# Patient Record
Sex: Female | Born: 1975 | Race: Black or African American | Hispanic: No | Marital: Single | Smoking: Current every day smoker
Health system: Southern US, Community
[De-identification: ages and names within clinical notes are randomized; demographics above are authoritative.]

## PROBLEM LIST (undated history)

## (undated) DIAGNOSIS — E119 Type 2 diabetes mellitus without complications: Secondary | ICD-10-CM

---

## 2020-09-23 ENCOUNTER — Encounter: Payer: Self-pay | Admitting: *Deleted

## 2020-09-23 ENCOUNTER — Other Ambulatory Visit: Payer: Self-pay

## 2020-09-23 DIAGNOSIS — L03032 Cellulitis of left toe: Secondary | ICD-10-CM | POA: Insufficient documentation

## 2020-09-23 DIAGNOSIS — Z5321 Procedure and treatment not carried out due to patient leaving prior to being seen by health care provider: Secondary | ICD-10-CM | POA: Insufficient documentation

## 2020-09-23 LAB — COMPREHENSIVE METABOLIC PANEL
ALT: 13 U/L (ref 0–44)
AST: 13 U/L — ABNORMAL LOW (ref 15–41)
Albumin: 4.1 g/dL (ref 3.5–5.0)
Alkaline Phosphatase: 81 U/L (ref 38–126)
Anion gap: 9 (ref 5–15)
BUN: 11 mg/dL (ref 6–20)
CO2: 23 mmol/L (ref 22–32)
Calcium: 9.1 mg/dL (ref 8.9–10.3)
Chloride: 101 mmol/L (ref 98–111)
Creatinine, Ser: 0.69 mg/dL (ref 0.44–1.00)
GFR, Estimated: >60 mL/min (ref >60)
Glucose, Bld: 363 mg/dL — ABNORMAL HIGH (ref 70–99)
Potassium: 3.6 mmol/L (ref 3.5–5.1)
Sodium: 133 mmol/L — ABNORMAL LOW (ref 135–145)
Total Bilirubin: 0.6 mg/dL (ref 0.3–1.2)
Total Protein: 7.6 g/dL (ref 6.5–8.1)

## 2020-09-23 LAB — CBC WITH DIFFERENTIAL/PLATELET
Abs Immature Granulocytes: 0.03 10*3/uL (ref 0.00–0.07)
Basophils Absolute: 0 10*3/uL (ref 0.0–0.1)
Basophils Relative: 0 %
Eosinophils Absolute: 0.1 10*3/uL (ref 0.0–0.5)
Eosinophils Relative: 1 %
HCT: 39.8 % (ref 36.0–46.0)
Hemoglobin: 14.2 g/dL (ref 12.0–15.0)
Immature Granulocytes: 0 %
Lymphocytes Relative: 21 %
Lymphs Abs: 2.7 10*3/uL (ref 0.7–4.0)
MCH: 32.2 pg (ref 26.0–34.0)
MCHC: 35.7 g/dL (ref 30.0–36.0)
MCV: 90.2 fL (ref 80.0–100.0)
Monocytes Absolute: 1.1 10*3/uL — ABNORMAL HIGH (ref 0.1–1.0)
Monocytes Relative: 9 %
Neutro Abs: 8.7 10*3/uL — ABNORMAL HIGH (ref 1.7–7.7)
Neutrophils Relative %: 69 %
Platelets: 317 10*3/uL (ref 150–400)
RBC: 4.41 MIL/uL (ref 3.87–5.11)
RDW: 12.5 % (ref 11.5–15.5)
WBC: 12.7 10*3/uL — ABNORMAL HIGH (ref 4.0–10.5)
nRBC: 0 % (ref 0.0–0.2)

## 2020-09-23 LAB — LACTIC ACID, PLASMA: Lactic Acid, Venous: 1.2 mmol/L (ref 0.5–1.9)

## 2020-09-23 NOTE — ED Triage Notes (Addendum)
Pt has acrylic toenails.  Right great toe is red and infected x 2 days.  Pt soaking foot at home without relief.  No known injury to right foot.   hx diabetic.   Pt alert  Speech clear.

## 2020-09-24 ENCOUNTER — Observation Stay
Admission: EM | Admit: 2020-09-24 | Discharge: 2020-09-25 | Disposition: A | Discharge status: Home / Self Care | Payer: Medicaid Other | Attending: Internal Medicine | Admitting: Internal Medicine

## 2020-09-24 ENCOUNTER — Emergency Department: Payer: Medicaid Other

## 2020-09-24 ENCOUNTER — Encounter: Payer: Self-pay | Admitting: Emergency Medicine

## 2020-09-24 ENCOUNTER — Emergency Department
Admission: EM | Admit: 2020-09-24 | Discharge: 2020-09-24 | Disposition: A | Discharge status: Home / Self Care | Payer: Self-pay | Attending: Emergency Medicine | Admitting: Emergency Medicine

## 2020-09-24 DIAGNOSIS — E1165 Type 2 diabetes mellitus with hyperglycemia: Secondary | ICD-10-CM | POA: Diagnosis not present

## 2020-09-24 DIAGNOSIS — F17200 Nicotine dependence, unspecified, uncomplicated: Secondary | ICD-10-CM | POA: Diagnosis present

## 2020-09-24 DIAGNOSIS — L089 Local infection of the skin and subcutaneous tissue, unspecified: Secondary | ICD-10-CM | POA: Diagnosis present

## 2020-09-24 DIAGNOSIS — E11628 Type 2 diabetes mellitus with other skin complications: Secondary | ICD-10-CM

## 2020-09-24 DIAGNOSIS — Z20822 Contact with and (suspected) exposure to covid-19: Secondary | ICD-10-CM | POA: Insufficient documentation

## 2020-09-24 DIAGNOSIS — M79671 Pain in right foot: Secondary | ICD-10-CM | POA: Diagnosis present

## 2020-09-24 DIAGNOSIS — E119 Type 2 diabetes mellitus without complications: Secondary | ICD-10-CM | POA: Insufficient documentation

## 2020-09-24 HISTORY — DX: Type 2 diabetes mellitus without complications: E11.9

## 2020-09-24 LAB — URINALYSIS, COMPLETE (UACMP) WITH MICROSCOPIC
Bacteria, UA: NONE SEEN
Bilirubin Urine: NEGATIVE
Glucose, UA: >=500 mg/dL — AB
Ketones, ur: 5 mg/dL — AB
Leukocytes,Ua: NEGATIVE
Nitrite: NEGATIVE
Protein, ur: NEGATIVE mg/dL
Specific Gravity, Urine: 1.039 — ABNORMAL HIGH (ref 1.005–1.030)
pH: 5 (ref 5.0–8.0)

## 2020-09-24 LAB — CBC WITH DIFFERENTIAL/PLATELET
Abs Immature Granulocytes: 0.05 10*3/uL (ref 0.00–0.07)
Basophils Absolute: 0 10*3/uL (ref 0.0–0.1)
Basophils Relative: 0 %
Eosinophils Absolute: 0.1 10*3/uL (ref 0.0–0.5)
Eosinophils Relative: 1 %
HCT: 41.9 % (ref 36.0–46.0)
Hemoglobin: 14.5 g/dL (ref 12.0–15.0)
Immature Granulocytes: 0 %
Lymphocytes Relative: 18 %
Lymphs Abs: 2.2 10*3/uL (ref 0.7–4.0)
MCH: 32.3 pg (ref 26.0–34.0)
MCHC: 34.6 g/dL (ref 30.0–36.0)
MCV: 93.3 fL (ref 80.0–100.0)
Monocytes Absolute: 1.3 10*3/uL — ABNORMAL HIGH (ref 0.1–1.0)
Monocytes Relative: 10 %
Neutro Abs: 8.9 10*3/uL — ABNORMAL HIGH (ref 1.7–7.7)
Neutrophils Relative %: 71 %
Platelets: 283 10*3/uL (ref 150–400)
RBC: 4.49 MIL/uL (ref 3.87–5.11)
RDW: 12.5 % (ref 11.5–15.5)
WBC: 12.7 10*3/uL — ABNORMAL HIGH (ref 4.0–10.5)
nRBC: 0 % (ref 0.0–0.2)

## 2020-09-24 LAB — COMPREHENSIVE METABOLIC PANEL
ALT: 11 U/L (ref 0–44)
AST: 12 U/L — ABNORMAL LOW (ref 15–41)
Albumin: 4 g/dL (ref 3.5–5.0)
Alkaline Phosphatase: 75 U/L (ref 38–126)
Anion gap: 11 (ref 5–15)
BUN: 11 mg/dL (ref 6–20)
CO2: 24 mmol/L (ref 22–32)
Calcium: 8.9 mg/dL (ref 8.9–10.3)
Chloride: 101 mmol/L (ref 98–111)
Creatinine, Ser: 0.53 mg/dL (ref 0.44–1.00)
GFR, Estimated: >60 mL/min (ref >60)
Glucose, Bld: 291 mg/dL — ABNORMAL HIGH (ref 70–99)
Potassium: 3.4 mmol/L — ABNORMAL LOW (ref 3.5–5.1)
Sodium: 136 mmol/L (ref 135–145)
Total Bilirubin: 0.9 mg/dL (ref 0.3–1.2)
Total Protein: 8 g/dL (ref 6.5–8.1)

## 2020-09-24 LAB — GLUCOSE, CAPILLARY
Glucose-Capillary: 249 mg/dL — ABNORMAL HIGH (ref 70–99)
Glucose-Capillary: 262 mg/dL — ABNORMAL HIGH (ref 70–99)

## 2020-09-24 LAB — HIV ANTIBODY (ROUTINE TESTING W REFLEX): HIV Screen 4th Generation wRfx: NONREACTIVE

## 2020-09-24 LAB — LACTIC ACID, PLASMA
Lactic Acid, Venous: 1.9 mmol/L (ref 0.5–1.9)
Lactic Acid, Venous: 1.5 mmol/L (ref 0.5–1.9)

## 2020-09-24 LAB — CBG MONITORING, ED: Glucose-Capillary: 279 mg/dL — ABNORMAL HIGH (ref 70–99)

## 2020-09-24 LAB — SARS CORONAVIRUS 2 (TAT 6-24 HRS): SARS Coronavirus 2: NEGATIVE

## 2020-09-24 MED ORDER — INSULIN ASPART 100 UNIT/ML IJ SOLN
0.0000 [IU] | Freq: Three times a day (TID) | INTRAMUSCULAR | Status: DC
  Administered 2020-09-24 – 2020-09-25 (×2): 5 [IU] via SUBCUTANEOUS
  Administered 2020-09-25: 3 [IU] via SUBCUTANEOUS
  Filled 2020-09-24 (×3): qty 1

## 2020-09-24 MED ORDER — VANCOMYCIN HCL IN DEXTROSE 1-5 GM/200ML-% IV SOLN
1000.0000 mg | Freq: Once | INTRAVENOUS | Status: AC
  Administered 2020-09-24: 1000 mg via INTRAVENOUS
  Filled 2020-09-24: qty 200

## 2020-09-24 MED ORDER — NICOTINE 21 MG/24HR TD PT24
21.0000 mg | MEDICATED_PATCH | Freq: Every day | TRANSDERMAL | Status: DC
  Administered 2020-09-24 – 2020-09-25 (×2): 21 mg via TRANSDERMAL
  Filled 2020-09-24 (×2): qty 1

## 2020-09-24 MED ORDER — ONDANSETRON HCL 4 MG PO TABS: 4.0000 mg | ORAL_TABLET | Freq: Four times a day (QID) | ORAL | Status: DC | PRN

## 2020-09-24 MED ORDER — SODIUM CHLORIDE 0.9 % IV SOLN: INTRAVENOUS | Status: DC

## 2020-09-24 MED ORDER — TRAMADOL HCL 50 MG PO TABS
50.0000 mg | ORAL_TABLET | Freq: Three times a day (TID) | ORAL | Status: DC | PRN
Start: 2020-09-24 — End: 2020-09-25
  Administered 2020-09-24 – 2020-09-25 (×3): 50 mg via ORAL
  Filled 2020-09-24 (×3): qty 1

## 2020-09-24 MED ORDER — ENOXAPARIN SODIUM 40 MG/0.4ML IJ SOSY
40.0000 mg | PREFILLED_SYRINGE | INTRAMUSCULAR | Status: DC
  Administered 2020-09-24: 40 mg via SUBCUTANEOUS
  Filled 2020-09-24: qty 0.4

## 2020-09-24 MED ORDER — CEFAZOLIN SODIUM-DEXTROSE 1-4 GM/50ML-% IV SOLN
1.0000 g | Freq: Three times a day (TID) | INTRAVENOUS | Status: DC
  Administered 2020-09-24 – 2020-09-25 (×3): 1 g via INTRAVENOUS
  Filled 2020-09-24 (×7): qty 50

## 2020-09-24 MED ORDER — ONDANSETRON HCL 4 MG/2ML IJ SOLN: 4.0000 mg | Freq: Four times a day (QID) | INTRAMUSCULAR | Status: DC | PRN

## 2020-09-24 MED ORDER — SODIUM CHLORIDE 0.9 % IV SOLN
1.0000 g | Freq: Once | INTRAVENOUS | Status: AC
  Administered 2020-09-24: 1 g via INTRAVENOUS
  Filled 2020-09-24: qty 1

## 2020-09-24 NOTE — ED Notes (Signed)
Lab called for blood culture draw only need one

## 2020-09-24 NOTE — ED Notes (Signed)
Visitor remains at bedside. Pt watching tv and eating.

## 2020-09-24 NOTE — ED Notes (Signed)
Called Camera operator to find out which RN assigned to pt as not currently listed in chart. Will report once floor RN info given to this RN.

## 2020-09-24 NOTE — ED Provider Notes (Signed)
[facility] Emergency Department Provider Note   ____________________________________________   Event Date/Time   First MD Initiated Contact with Patient 09/24/20 1007     (approximate)  I have reviewed the triage vital signs and the nursing notes.   HISTORY  Chief Complaint Foot Pain   HPI Anne Hanson is a 45 y.o. female with past medical history of diabetes who presents to the ED complaining of foot pain.  Patient states that she had her nails done about 1 week ago, since then has noticed increasing pain, redness, and swelling to her right great toe.  Redness and pain has started to track up the dorsum of her right foot and into her leg.  She denies any associated fevers, nausea, vomiting, cough, chest pain, or shortness of breath.  She has not noticed any ulcers or open wounds to her foot.  She has never had similar issues with her feet before.        History reviewed. No pertinent past medical history.  There are no problems to display for this patient.   History reviewed. No pertinent surgical history.  Prior to Admission medications   Not on File    Allergies Penicillins  No family history on file.  Social History Social History   Tobacco Use  . Smoking status: Never Smoker  . Smokeless tobacco: Never Used    Review of Systems  Constitutional: No fever/chills Eyes: No visual changes. ENT: No sore throat. Cardiovascular: Denies chest pain. Respiratory: Denies shortness of breath. Gastrointestinal: No abdominal pain.  No nausea, no vomiting.  No diarrhea.  No constipation. Genitourinary: Negative for dysuria. Musculoskeletal: Negative for back pain.  Positive for foot pain and swelling. Skin: Negative for rash. Neurological: Negative for headaches, focal weakness or numbness.  ____________________________________________   PHYSICAL EXAM:  VITAL SIGNS: ED Triage Vitals  Enc Vitals Group     BP 09/24/20 1000 (!)  135/91     Pulse Rate 09/24/20 1000 99     Resp 09/24/20 1000 20     Temp 09/24/20 1000 98.3 F (36.8 C)     Temp Source 09/24/20 1000 Oral     SpO2 09/24/20 1000 100 %     Weight 09/24/20 0957 175 lb 14.8 oz (79.8 kg)     Height 09/24/20 0957 5\' 6"  (1.676 m)     Head Circumference --      Peak Flow --      Pain Score 09/24/20 0957 5     Pain Loc --      Pain Edu? --      Excl. in GC? --     Constitutional: Alert and oriented. Eyes: Conjunctivae are normal. Head: Atraumatic. Nose: No congestion/rhinnorhea. Mouth/Throat: Mucous membranes are moist. Neck: Normal ROM Cardiovascular: Normal rate, regular rhythm. Grossly normal heart sounds.  2+ DP pulses bilaterally. Respiratory: Normal respiratory effort.  No retractions. Lungs CTAB. Gastrointestinal: Soft and nontender. No distention. Genitourinary: deferred Musculoskeletal: Circumferential edema, erythema, and warmth to right great toe with erythema tracking along dorsum of right foot and into medial portion of her right lower leg.  Great toe and dorsum of right foot are tender to palpation. Neurologic:  Normal speech and language. No gross focal neurologic deficits are appreciated. Skin:  Skin is warm, dry and intact. No rash noted. Psychiatric: Mood and affect are normal. Speech and behavior are normal.  ____________________________________________   LABS (all labs ordered are listed, but only abnormal results are displayed)  Labs Reviewed  CBC WITH DIFFERENTIAL/PLATELET - Abnormal; Notable for the following components:      Result Value   WBC 12.7 (*)    Neutro Abs 8.9 (*)    Monocytes Absolute 1.3 (*)    All other components within normal limits  CBG MONITORING, ED - Abnormal; Notable for the following components:   Glucose-Capillary 279 (*)    All other components within normal limits  CULTURE, BLOOD (ROUTINE X 2)  CULTURE, BLOOD (ROUTINE X 2)  SARS CORONAVIRUS 2 (TAT 6-24 HRS)  LACTIC ACID, PLASMA  LACTIC ACID,  PLASMA  URINALYSIS, COMPLETE (UACMP) WITH MICROSCOPIC  COMPREHENSIVE METABOLIC PANEL    PROCEDURES  Procedure(s) performed (including Critical Care):  Procedures   ____________________________________________   INITIAL IMPRESSION / ASSESSMENT AND PLAN / ED COURSE       45 year old female with past medical history of diabetes presents to the ED with 1 week of increasing pain and swelling to her right great toe now with redness tracking up her right leg.  She denies any systemic symptoms and vital signs are reassuring, no evidence of sepsis at this time.  Appearance of toe is concerning for osteomyelitis and we will further assess with x-ray, also check labs including blood cultures.  We will start on broad-spectrum antibiotics and anticipate admission for further management.  Foot x-ray reviewed by me with no obvious signs of osteomyelitis.  Patient tolerated cefepime with no signs of allergic reaction at this time.  Case discussed with hospitalist for admission.      ____________________________________________   FINAL CLINICAL IMPRESSION(S) / ED DIAGNOSES  Final diagnoses:  Diabetic foot infection [facility])     ED Discharge Orders    None       Note:  This document was prepared using Dragon voice recognition software and may include unintentional dictation errors.   Chesley Noon, MD 09/24/20 [phone]

## 2020-09-24 NOTE — ED Notes (Signed)
Advised Lab that a tech still hasn't drawn blood on the pt

## 2020-09-24 NOTE — ED Notes (Signed)
Called dietary and ordered lunch tray for pt.

## 2020-09-24 NOTE — ED Notes (Signed)
Dietary staff dropped off food tray to bedside.

## 2020-09-24 NOTE — H&P (Signed)
History and Physical    Jaylina Ramdass EPP:[id number] DOB: 08/28/1975 DOA: 09/24/2020  PCP: System, Provider Not In   Patient coming from: Home  I have personally briefly reviewed patient's old medical records in [facility]  Chief Complaint: Right great toe redness and swelling  HPI: Atalia Litzinger is a 45 y.o. female with medical history significant for nicotine dependence and insulin-dependent diabetes mellitus who presents to the emergency room for evaluation of pain, redness and swelling involving her right great toe with streaking involving the dorsum of her right foot. Symptoms have been ongoing for 3 days and has progressively worsened.  Patient noticed her symptoms a couple of days after having a pedicure. She denies having any fever, no chills, no nausea, no vomiting, no chest pain, no cough, no shortness of breath, no headache, no dizziness, no lightheadedness, no changes in her bowel habits, no abdominal pain. Labs show sodium 136, potassium 3.4, chloride 101, bicarb 24, glucose 291, BUN 11, creatinine 0.53, calcium 8.9, alkaline phosphatase 75, albumin 4.0, AST 12, ALT 11, total protein 8.0, lactic acid 1.9, white count 12.7, hemoglobin 14.5, hematocrit 41.9, MCV 93.3, RDW 12.5, platelet count 283 Right foot x-ray shows no acute abnormality of the right foot. If there is continued concern for great toe great toe osteomyelitis, further evaluation with MRI should be performed.   ED Course: Patient is a 45 year old female with a history of nicotine dependence as well as insulin dependent diabetes mellitus who presents to the emergency room for evaluation of redness, pain and swelling involving her right great toe and streaking involving the dorsum of the right foot. She received a dose of IV vancomycin and cefepime in the ER and will be admitted to the hospital for further evaluation.    Review of Systems: As per HPI otherwise all other systems reviewed and negative.     Past Medical History:  Diagnosis Date  . Diabetes mellitus without complication (HCC)     History reviewed. No pertinent surgical history.   reports that she has been smoking cigarettes. She has been smoking about 1.00 pack per day. She has never used smokeless tobacco. She reports that she does not drink alcohol and does not use drugs.  Allergies  Allergen Reactions  . Penicillins Nausea Only    Family History  Problem Relation Age of Onset  . Diabetes Mother       Prior to Admission medications   Not on File    Physical Exam: Vitals:   09/24/20 0957 09/24/20 1000 09/24/20 1230  BP:  (!) 135/91 115/70  Pulse:  99 78  Resp:  20   Temp:  98.3 F (36.8 C)   TempSrc:  Oral   SpO2:  100% 98%  Weight: 79.8 kg 79.8 kg   Height: 5\' 6"  (1.676 m) 5\' 6"  (1.676 m)      Vitals:   09/24/20 0957 09/24/20 1000 09/24/20 1230  BP:  (!) 135/91 115/70  Pulse:  99 78  Resp:  20   Temp:  98.3 F (36.8 C)   TempSrc:  Oral   SpO2:  100% 98%  Weight: 79.8 kg 79.8 kg   Height: 5\' 6"  (1.676 m) 5\' 6"  (1.676 m)       Constitutional: Alert and oriented x 3 . Not in any apparent distress HEENT:      Head: Normocephalic and atraumatic.         Eyes: PERLA, EOMI, Conjunctivae are normal. Sclera is non-icteric.  Mouth/Throat: Mucous membranes are moist.       Neck: Supple with no signs of meningismus. Cardiovascular: Regular rate and rhythm. No murmurs, gallops, or rubs. 2+ symmetrical distal pulses are present . No JVD. No LE edema Respiratory: Respiratory effort normal .Lungs sounds clear bilaterally. No wheezes, crackles, or rhonchi.  Gastrointestinal: Soft, non tender, and non distended with positive bowel sounds.  Genitourinary: No CVA tenderness. Musculoskeletal: Nontender with normal range of motion in all extremities. No cyanosis, or erythema of extremities. Neurologic:  Face is symmetric. Moving all extremities. No gross focal neurologic deficits  Skin: Skin is  warm, dry.  Redness and swelling of the right great toe with streaking to involve the dorsum of the right foot. Psychiatric: Mood and affect are normal   Labs on Admission: I have personally reviewed following labs and imaging studies  CBC: Recent Labs  Lab 09/23/20 2257 09/24/20 1000  WBC 12.7* 12.7*  NEUTROABS 8.7* 8.9*  HGB 14.2 14.5  HCT 39.8 41.9  MCV 90.2 93.3  PLT 317 283   Basic Metabolic Panel: Recent Labs  Lab 09/23/20 2257 09/24/20 1236  NA 133* 136  K 3.6 3.4*  CL 101 101  CO2 23 24  GLUCOSE 363* 291*  BUN 11 11  CREATININE 0.69 0.53  CALCIUM 9.1 8.9   GFR: Estimated Creatinine Clearance: 94.6 mL/min (by C-G formula based on SCr of 0.53 mg/dL). Liver Function Tests: Recent Labs  Lab 09/23/20 2257 09/24/20 1236  AST 13* 12*  ALT 13 11  ALKPHOS 81 75  BILITOT 0.6 0.9  PROT 7.6 8.0  ALBUMIN 4.1 4.0   No results for input(s): LIPASE, AMYLASE in the last 168 hours. No results for input(s): AMMONIA in the last 168 hours. Coagulation Profile: No results for input(s): INR, PROTIME in the last 168 hours. Cardiac Enzymes: No results for input(s): CKTOTAL, CKMB, CKMBINDEX, TROPONINI in the last 168 hours. BNP (last 3 results) No results for input(s): PROBNP in the last 8760 hours. HbA1C: No results for input(s): HGBA1C in the last 72 hours. CBG: Recent Labs  Lab 09/24/20 1138  GLUCAP 279*   Lipid Profile: No results for input(s): CHOL, HDL, LDLCALC, TRIG, CHOLHDL, LDLDIRECT in the last 72 hours. Thyroid Function Tests: No results for input(s): TSH, T4TOTAL, FREET4, T3FREE, THYROIDAB in the last 72 hours. Anemia Panel: No results for input(s): VITAMINB12, FOLATE, FERRITIN, TIBC, IRON, RETICCTPCT in the last 72 hours. Urine analysis: No results found for: COLORURINE, APPEARANCEUR, LABSPEC, PHURINE, GLUCOSEU, HGBUR, BILIRUBINUR, KETONESUR, PROTEINUR, UROBILINOGEN, NITRITE, LEUKOCYTESUR  Radiological Exams on Admission: DG Foot 2 Views  Right  Result Date: 09/24/2020 CLINICAL DATA:  Infection Right great toe is red and infected for 3 days EXAM: RIGHT FOOT - 2 VIEW COMPARISON:  None. FINDINGS: There is no evidence of fracture or dislocation. There is no evidence of arthropathy or other focal bone abnormality. Soft tissues are unremarkable. IMPRESSION: No acute abnormality of the right foot. If there is continued concern for right great toe osteomyelitis, further evaluation with MRI should be performed. Electronically Signed   By: Acquanetta Belling M.D.   On: 09/24/2020 11:18     Assessment/Plan Principal Problem:   Diabetic foot infection (HCC) Active Problems:   Nicotine dependence   Diabetes mellitus with hyperglycemia (HCC)     Diabetic foot infection/cellulitis of right great toe Patient presents for evaluation of redness, swelling and differential warmth involving the right great toe with streaking involving the dorsum of the right foot. She has a white count  of 12,000 but is afebrile We will start patient empirically on IV cefazolin Keep right foot elevated Consult podiatry    Diabetes mellitus with hyperglycemia Patient has a history of insulin-dependent diabetes mellitus She has hyperglycemia most likely related to her infectious process We will place patient on sliding scale insulin Accu-Cheks before meals and at bedtime      Nicotine dependence Smoking cessation has been discussed with patient in detail We will place patient on a nicotine transdermal patch 21 mg daily      DVT prophylaxis: Lovenox Code Status: full code Family Communication: Greater than 50% of time was spent discussing plan of care with patient at the bedside.  All questions and concerns have been addressed.  She verbalizes understanding and agrees to the plan. Disposition Plan: Back to previous home environment Consults called: Podiatry Status: At the time of admission, it appears that the appropriate admission status for this  patient is inpatient. This is judged to be reasonable and necessary in order to provide the required intensity of service to ensure the patient's safety given the presenting symptoms, physical exam findings and initial radiographic and laboratory data in the context of their comorbid conditions. Patient requires inpatient status due to high intensity of service, high risk for further deterioration and high frequency of surveillance required.    Lucile Shutters MD [facility]     09/24/2020, 1:17 PM

## 2020-09-24 NOTE — ED Notes (Signed)
Informed RN bed assigned 

## 2020-09-24 NOTE — ED Triage Notes (Signed)
C/O swelling to right great toe 3 days ago and redness to great toe and redness streaking up right leg.

## 2020-09-24 NOTE — ED Notes (Signed)
Lab advised green top hemolyzed , advised that we still need blood culture #2 to be drawn, and to have lab tech draw green tube with blood culture.

## 2020-09-25 DIAGNOSIS — L089 Local infection of the skin and subcutaneous tissue, unspecified: Secondary | ICD-10-CM | POA: Diagnosis not present

## 2020-09-25 DIAGNOSIS — E11628 Type 2 diabetes mellitus with other skin complications: Secondary | ICD-10-CM

## 2020-09-25 LAB — CBC
HCT: 37.8 % (ref 36.0–46.0)
Hemoglobin: 13.1 g/dL (ref 12.0–15.0)
MCH: 31.6 pg (ref 26.0–34.0)
MCHC: 34.7 g/dL (ref 30.0–36.0)
MCV: 91.3 fL (ref 80.0–100.0)
Platelets: 283 10*3/uL (ref 150–400)
RBC: 4.14 MIL/uL (ref 3.87–5.11)
RDW: 12.3 % (ref 11.5–15.5)
WBC: 11.8 10*3/uL — ABNORMAL HIGH (ref 4.0–10.5)
nRBC: 0 % (ref 0.0–0.2)

## 2020-09-25 LAB — BASIC METABOLIC PANEL
Anion gap: 5 (ref 5–15)
BUN: 11 mg/dL (ref 6–20)
CO2: 25 mmol/L (ref 22–32)
Calcium: 8.3 mg/dL — ABNORMAL LOW (ref 8.9–10.3)
Chloride: 105 mmol/L (ref 98–111)
Creatinine, Ser: 0.43 mg/dL — ABNORMAL LOW (ref 0.44–1.00)
GFR, Estimated: >60 mL/min (ref >60)
Glucose, Bld: 249 mg/dL — ABNORMAL HIGH (ref 70–99)
Potassium: 3.4 mmol/L — ABNORMAL LOW (ref 3.5–5.1)
Sodium: 135 mmol/L (ref 135–145)

## 2020-09-25 LAB — HEMOGLOBIN A1C
Hgb A1c MFr Bld: 11.5 % — ABNORMAL HIGH (ref 4.8–5.6)
Mean Plasma Glucose: 283 mg/dL

## 2020-09-25 LAB — GLUCOSE, CAPILLARY
Glucose-Capillary: 223 mg/dL — ABNORMAL HIGH (ref 70–99)
Glucose-Capillary: 190 mg/dL — ABNORMAL HIGH (ref 70–99)

## 2020-09-25 MED ORDER — SENNOSIDES-DOCUSATE SODIUM 8.6-50 MG PO TABS
1.0000 | ORAL_TABLET | Freq: Two times a day (BID) | ORAL | Status: DC
  Administered 2020-09-25: 1 via ORAL
  Filled 2020-09-25: qty 1

## 2020-09-25 MED ORDER — INSULIN GLARGINE 100 UNIT/ML ~~LOC~~ SOLN
10.0000 [IU] | Freq: Every day | SUBCUTANEOUS | Status: DC
  Administered 2020-09-25: 10 [IU] via SUBCUTANEOUS
  Filled 2020-09-25 (×2): qty 0.1

## 2020-09-25 MED ORDER — INSULIN GLARGINE 100 UNIT/ML ~~LOC~~ SOLN
5.0000 [IU] | Freq: Every day | SUBCUTANEOUS | Status: DC
  Filled 2020-09-25: qty 0.05

## 2020-09-25 MED ORDER — SODIUM CHLORIDE 0.9 % IV SOLN
1.0000 g | Freq: Three times a day (TID) | INTRAVENOUS | Status: DC
  Filled 2020-09-25 (×5): qty 10

## 2020-09-25 MED ORDER — TRAMADOL HCL 50 MG PO TABS: 50.0000 mg | ORAL_TABLET | Freq: Three times a day (TID) | ORAL | 0 refills | Status: AC | PRN

## 2020-09-25 MED ORDER — CEPHALEXIN 500 MG PO CAPS: 500.0000 mg | ORAL_CAPSULE | Freq: Four times a day (QID) | ORAL | 0 refills | Status: AC

## 2020-09-25 MED ORDER — ONDANSETRON HCL 4 MG PO TABS: 4.0000 mg | ORAL_TABLET | Freq: Four times a day (QID) | ORAL | 0 refills | Status: AC | PRN

## 2020-09-25 NOTE — Progress Notes (Signed)
   PODIATRY PROGRESS NOTE  NAME Anne Hanson MRN [id number] DOB 15-Dec-1975 DOA 09/24/2020   ASSESSMENT/PLAN OF CARE S/p total temporary nail avulsion RT great toe - patient resting comfortably ready for discharge. Doing well.  - post care instructions provided verbally to patient. Silvadene cream provided to apply to the area daily with a bandaid - daily epsom salt soaks recommended - dc on oral antibiotics as per hospitalist - f/u in office next week, 10/01/2020. Podiatry to sign off.    Please contact me directly with any questions or concerns.  Cell [phone]   Felecia Shelling, DPM [facility]  Dr. Felecia Shelling, DPM    [address] [place], [place] [address]                Office [phone]  Fax [phone]

## 2020-09-25 NOTE — Plan of Care (Signed)
Continuing with plan of care. 

## 2020-09-25 NOTE — Consult Note (Signed)
PODIATRY CONSULTATION  NAME Anne Hanson MRN [id number] DOB 1976/04/07 DOA 09/24/2020   Reason for consult: No chief complaint on file.   Consulting physician: RT great toe abscess/cellulitis  History of present illness: 45 y.o. female female with medical history significant for nicotine dependence and insulin-dependent diabetes mellitus who presents to the emergency room for evaluation of pain, redness and swelling involving her right great toe with streaking involving the dorsum of her right foot x3 days which is progressively worsened.  Upon evaluation in the ED podiatry consulted.  She presents today sleeping comfortably at bedside upon evaluation.  Past Medical History:  Diagnosis Date  . Diabetes mellitus without complication (HCC)     CBC Latest Ref Rng & Units 09/25/2020 09/24/2020 09/23/2020  WBC 4.0 - 10.5 K/uL 11.8(H) 12.7(H) 12.7(H)  Hemoglobin 12.0 - 15.0 g/dL 58.0 99.8 33.8  Hematocrit 36.0 - 46.0 % 37.8 41.9 39.8  Platelets 150 - 400 K/uL 283 283 317    BMP Latest Ref Rng & Units 09/25/2020 09/24/2020 09/23/2020  Glucose 70 - 99 mg/dL 250(N) 397(Q) 734(L)  BUN 6 - 20 mg/dL 11 11 11   Creatinine 0.44 - 1.00 mg/dL ) 9.37(T 0.24  Sodium 135 - 145 mmol/L 135 136 133(L)  Potassium 3.5 - 5.1 mmol/L 3.4(L) 3.4(L) 3.6  Chloride 98 - 111 mmol/L 105 101 101  CO2 22 - 32 mmol/L 25 24 23   Calcium 8.9 - 10.3 mg/dL 8.3(L) 8.9 9.1      Physical Exam: General: The patient is alert and oriented x3 in no acute distress.   Dermatology: Localized erythema and edema noted to the right great toe.  There does appear to be some purulence and abscess to the base of the nail plate right hallux.  The right hallux nail plate is loosely adhered.  Patient states that the nail plate is an acrylic nail.  Associated tenderness to palpation  Vascular: Palpable pedal pulses bilaterally. No edema or erythema noted throughout the foot with the exception of the RT great toe. Capillary refill  immediate.  Neurological: Epicritic and protective threshold grossly intact bilaterally.   Musculoskeletal Exam: No structural deformity noted.    ASSESSMENT/PLAN OF CARE 1.  Abscess RT hallux nail plate with cellulitis - Upon evaluation I explained to the patient it would be in her best interest to have the nail plate removed since it is loosely adhered and there is abscess which is noted at the base of the nail plate.  The patient agrees.  The bedside procedure was explained to the patient in detail. - Digital block was performed using 3 mL of 2% lidocaine plain.  The toe was prepped in aseptic manner and the right hallux nail plate was avulsed in its entirety.  Underlying abscess was cleansed and a light dressing was applied.  Keep clean, dry, and intact x24 hours - Continue IV antibiotics as per admitting hospitalist, IV cefazolin. - Elevated WBC of 12.7.  I suspect over the next 24 hours WBC should improve.  I will come and see the patient tomorrow early afternoon and if new labs are WNL from a podiatry standpoint she may be discharged on oral antibiotics -Podiatry will continue to follow     Thank you for the consult.  Please contact me directly with any questions or concerns.  Cell [phone]   , DPM [facility]  Dr. [phone], DPM    [address]  [place], [place] [address]                Office [phone]  Fax [phone]

## 2020-09-25 NOTE — Discharge Summary (Signed)
Physician Discharge Summary  Anne Hanson WYO:[id number] DOB: 03-Jun-1975 DOA: 09/24/2020  PCP: System, Provider Not In  Admit date: 09/24/2020 Discharge date: 09/25/2020  Admitted From: Home Disposition:  Home  Recommendations for Outpatient Follow-up:  1. Follow up with PCP in 1-2 weeks 2. Follow up with podiatry 1 week  Home Health:No Equipment/Devices:None Discharge Condition:Stable CODE STATUS:FULL Diet recommendation: Heart Healthy / Carb Modified  Brief/Interim Summary:  45 y.o. female with medical history significant for nicotine dependence and insulin-dependent diabetes mellitus who presents to the emergency room for evaluation of pain, redness and swelling involving her right great toe with streaking involving the dorsum of her right foot. Symptoms have been ongoing for 3 days and has progressively worsened.  Patient noticed her symptoms a couple of days after having a pedicure. She denies having any fever, no chills, no nausea, no vomiting, no chest pain, no cough, no shortness of breath, no headache, no dizziness, no lightheadedness, no changes in her bowel habits, no abdominal pain. 6/4: Case d/w podiatry.  Cleared for discharge from their standpoint.  OK to remove dressings at home.  Dress wound with antibiotic ointment and bandaid.  Complete 7 days of keflex.  Pain medication and antiemetics prn have been prescribed. DC home in stable condition  Discharge Diagnoses:  Principal Problem:   Diabetic foot infection (HCC) Active Problems:   Nicotine dependence   Diabetes mellitus with hyperglycemia (HCC)   Diabetic foot infection/cellulitis of right great toe Patient presents for evaluation of redness, swelling and differential warmth involving the right great toe with streaking involving the dorsum of the right - S/p bedside debridement with podiatry WBC downtrending at time of discharge Cleared for dc Start keflex 500 qid x 7 days PRN pain meds and antiemetic DC home  with 1 week followup with podiatry   Diabetes mellitus with hyperglycemia Patient has a history of insulin-dependent diabetes mellitus She has hyperglycemia most likely related to her infectious process Can resume home regimen on discharge      Nicotine dependence Smoking cessation has been discussed with patient in detail We will place patient on a nicotine transdermal patch 21 mg daily  Discharge Instructions  Discharge Instructions    Diet - low sodium heart healthy   Complete by: As directed    Increase activity slowly   Complete by: As directed      Allergies as of 09/25/2020      Reactions   Penicillins Nausea Only      Medication List    TAKE these medications   cephALEXin 500 MG capsule Commonly known as: KEFLEX Take 1 capsule (500 mg total) by mouth 4 (four) times daily for 7 days. Start taking on: September 26, 2020   ondansetron 4 MG tablet Commonly known as: ZOFRAN Take 1 tablet (4 mg total) by mouth every 6 (six) hours as needed for nausea.   traMADol 50 MG tablet Commonly known as: ULTRAM Take 1 tablet (50 mg total) by mouth every 8 (eight) hours as needed (mild pain).       Follow-up Information    Felecia Shelling, DPM. Schedule an appointment as soon as possible for a visit in 1 week(s).   Specialty: Podiatry Contact information: [address] [place] [address] [place] [address] [phone]              Allergies  Allergen Reactions  . Penicillins Nausea Only    Consultations:  Podiatry- TFA   Procedures/Studies: DG Foot 2 Views Right  Result  Date: 09/24/2020 CLINICAL DATA:  Infection Right great toe is red and infected for 3 days EXAM: RIGHT FOOT - 2 VIEW COMPARISON:  None. FINDINGS: There is no evidence of fracture or dislocation. There is no evidence of arthropathy or other focal bone abnormality. Soft tissues are unremarkable. IMPRESSION: No acute abnormality of the right foot. If there is continued concern for right great toe  osteomyelitis, further evaluation with MRI should be performed. Electronically Signed   By: Acquanetta Belling M.D.   On: 09/24/2020 11:18    (Echo, Carotid, EGD, Colonoscopy, ERCP)    Subjective: Seen and examined.  Pain well controlled.  Stable for dc home  Discharge Exam: Vitals:   09/25/20 0800 09/25/20 1136  BP: 119/81 (!) 137/94  Pulse: 84 86  Resp: 17 16  Temp: 99.1 F (37.3 C)   SpO2: 100% 100%   Vitals:   09/24/20 2249 09/25/20 0253 09/25/20 0800 09/25/20 1136  BP: 121/73 119/77 119/81 (!) 137/94  Pulse: 87 89 84 86  Resp: 18 16 17 16   Temp: 98.9 F (37.2 C) 98.5 F (36.9 C) 99.1 F (37.3 C)   TempSrc:      SpO2: 100% 100% 100% 100%  Weight:      Height:        General: Pt is alert, awake, not in acute distress Cardiovascular: RRR, S1/S2 +, no rubs, no gallops Respiratory: CTA bilaterally, no wheezing, no rhonchi Abdominal: Soft, NT, ND, bowel sounds + Extremities: no edema, no cyanosis    The results of significant diagnostics from this hospitalization (including imaging, microbiology, ancillary and laboratory) are listed below for reference.     Microbiology: Recent Results (from the past 240 hour(s))  Culture, blood (routine x 2)     Status: None (Preliminary result)   Collection Time: 09/24/20 10:27 AM   Specimen: Right Antecubital; Blood  Result Value Ref Range Status   Specimen Description RIGHT ANTECUBITAL  Final   Special Requests   Final    BOTTLES DRAWN AEROBIC AND ANAEROBIC Blood Culture results may not be optimal due to an inadequate volume of blood received in culture bottles   Culture   Final    NO GROWTH < 24 HOURS Performed at [facility], [address] [place], [place]    Report Status PENDING  Incomplete  SARS CORONAVIRUS 2 (TAT 6-24 HRS) Nasopharyngeal Nasopharyngeal Swab     Status: None   Collection Time: 09/24/20 10:36 AM   Specimen: Nasopharyngeal Swab  Result Value Ref Range Status   SARS Coronavirus 2  NEGATIVE NEGATIVE Final    Comment: (NOTE) SARS-CoV-2 target nucleic acids are NOT DETECTED.  The SARS-CoV-2 RNA is generally detectable in upper and lower respiratory specimens during the acute phase of infection. Negative results do not preclude SARS-CoV-2 infection, do not rule out co-infections with other pathogens, and should not be used as the sole basis for treatment or other patient management decisions. Negative results must be combined with clinical observations, patient history, and epidemiological information. The expected result is Negative.  Fact Sheet for Patients: 11/24/20  Fact Sheet for Healthcare Providers: HairSlick.no  This test is not yet approved or cleared by the [place] FDA and  has been authorized for detection and/or diagnosis of SARS-CoV-2 by FDA under an Emergency Use Authorization (EUA). This EUA will remain  in effect (meaning this test can be used) for the duration of the COVID-19 declaration under Se ction 564(b)(1) of the Act, 21 U.S.C. section 360bbb-3(b)(1), unless  the authorization is terminated or revoked sooner.  Performed at [facility], [address], [place], [place] [address]   Culture, blood (routine x 2)     Status: None (Preliminary result)   Collection Time: 09/24/20 12:36 PM   Specimen: BLOOD  Result Value Ref Range Status   Specimen Description BLOOD BLOOD RIGHT HAND  Final   Special Requests   Final    BOTTLES DRAWN AEROBIC AND ANAEROBIC Blood Culture adequate volume   Culture   Final    NO GROWTH < 24 HOURS Performed at [facility], [address] [place], [place] [address]    Report Status PENDING  Incomplete     Labs: BNP (last 3 results) No results for input(s): BNP in the last 8760 hours. Basic Metabolic Panel: Recent Labs  Lab 09/23/20 2257 09/24/20 1236 09/25/20 0454  NA 133* 136 135  K 3.6 3.4* 3.4*  CL 101 101 105   CO2 23 24 25   GLUCOSE 363* 291* 249*  BUN 11 11 11   CREATININE 0.69 0.53 0.43*  CALCIUM 9.1 8.9 8.3*   Liver Function Tests: Recent Labs  Lab 09/23/20 2257 09/24/20 1236  AST 13* 12*  ALT 13 11  ALKPHOS 81 75  BILITOT 0.6 0.9  PROT 7.6 8.0  ALBUMIN 4.1 4.0   No results for input(s): LIPASE, AMYLASE in the last 168 hours. No results for input(s): AMMONIA in the last 168 hours. CBC: Recent Labs  Lab 09/23/20 2257 09/24/20 1000 09/25/20 0454  WBC 12.7* 12.7* 11.8*  NEUTROABS 8.7* 8.9*  --   HGB 14.2 14.5 13.1  HCT 39.8 41.9 37.8  MCV 90.2 93.3 91.3  PLT 317 283 283   Cardiac Enzymes: No results for input(s): CKTOTAL, CKMB, CKMBINDEX, TROPONINI in the last 168 hours. BNP: Invalid input(s): POCBNP CBG: Recent Labs  Lab 09/24/20 1138 09/24/20 1718 09/24/20 2022 09/25/20 0754 09/25/20 1134  GLUCAP 279* 249* 262* 223* 190*   D-Dimer No results for input(s): DDIMER in the last 72 hours. Hgb A1c Recent Labs    09/24/20 1358  HGBA1C 11.5*   Lipid Profile No results for input(s): CHOL, HDL, LDLCALC, TRIG, CHOLHDL, LDLDIRECT in the last 72 hours. Thyroid function studies No results for input(s): TSH, T4TOTAL, T3FREE, THYROIDAB in the last 72 hours.  Invalid input(s): FREET3 Anemia work up No results for input(s): VITAMINB12, FOLATE, FERRITIN, TIBC, IRON, RETICCTPCT in the last 72 hours. Urinalysis    Component Value Date/Time   COLORURINE YELLOW (A) 09/24/2020 1200   APPEARANCEUR HAZY (A) 09/24/2020 1200   LABSPEC 1.039 (H) 09/24/2020 1200   PHURINE 5.0 09/24/2020 1200   GLUCOSEU >=500 (A) 09/24/2020 1200   HGBUR SMALL (A) 09/24/2020 1200   BILIRUBINUR NEGATIVE 09/24/2020 1200   KETONESUR 5 (A) 09/24/2020 1200   PROTEINUR NEGATIVE 09/24/2020 1200   NITRITE NEGATIVE 09/24/2020 1200   LEUKOCYTESUR NEGATIVE 09/24/2020 1200   Sepsis Labs Invalid input(s): PROCALCITONIN,  WBC,  LACTICIDVEN Microbiology Recent Results (from the past 240 hour(s))   Culture, blood (routine x 2)     Status: None (Preliminary result)   Collection Time: 09/24/20 10:27 AM   Specimen: Right Antecubital; Blood  Result Value Ref Range Status   Specimen Description RIGHT ANTECUBITAL  Final   Special Requests   Final    BOTTLES DRAWN AEROBIC AND ANAEROBIC Blood Culture results may not be optimal due to an inadequate volume of blood received in culture bottles   Culture   Final    NO  GROWTH < 24 HOURS Performed at [facility], [address] [place], [place] [address]    Report Status PENDING  Incomplete  SARS CORONAVIRUS 2 (TAT 6-24 HRS) Nasopharyngeal Nasopharyngeal Swab     Status: None   Collection Time: 09/24/20 10:36 AM   Specimen: Nasopharyngeal Swab  Result Value Ref Range Status   SARS Coronavirus 2 NEGATIVE NEGATIVE Final    Comment: (NOTE) SARS-CoV-2 target nucleic acids are NOT DETECTED.  The SARS-CoV-2 RNA is generally detectable in upper and lower respiratory specimens during the acute phase of infection. Negative results do not preclude SARS-CoV-2 infection, do not rule out co-infections with other pathogens, and should not be used as the sole basis for treatment or other patient management decisions. Negative results must be combined with clinical observations, patient history, and epidemiological information. The expected result is Negative.  Fact Sheet for Patients: HairSlick.nohttps://www.fda.gov/media/138098/download  Fact Sheet for Healthcare Providers: quierodirigir.comhttps://www.fda.gov/media/138095/download  This test is not yet approved or cleared by the [place] FDA and  has been authorized for detection and/or diagnosis of SARS-CoV-2 by FDA under an Emergency Use Authorization (EUA). This EUA will remain  in effect (meaning this test can be used) for the duration of the COVID-19 declaration under Se ction 564(b)(1) of the Act, 21 U.S.C. section 360bbb-3(b)(1), unless the authorization is terminated or revoked sooner.  Performed  at [facility], [address] [place], [place] [address]   Culture, blood (routine x 2)     Status: None (Preliminary result)   Collection Time: 09/24/20 12:36 PM   Specimen: BLOOD  Result Value Ref Range Status   Specimen Description BLOOD BLOOD RIGHT HAND  Final   Special Requests   Final    BOTTLES DRAWN AEROBIC AND ANAEROBIC Blood Culture adequate volume   Culture   Final    NO GROWTH < 24 HOURS Performed at [facility], [address] [place], [place] [address]    Report Status PENDING  Incomplete     Time coordinating discharge: Over 30 minutes  SIGNED:   Tresa MooreSudheer B Margarethe Virgen, MD  [facility] 09/25/2020, 2:22 PM Pager   If 7PM-7AM, please contact night-coverage

## 2020-09-25 NOTE — Discharge Instructions (Signed)
OK to remove dressing once you get home.  Apply antibiotic ointment and cover wound with a bandaid. Follow up with Dr. Logan Bores from podiatry in 1 week

## 2020-09-25 NOTE — Plan of Care (Signed)
Discharge teaching completed with patient who is in stable condition. 

## 2020-09-29 LAB — CULTURE, BLOOD (ROUTINE X 2)
Culture: NO GROWTH
Culture: NO GROWTH
Special Requests: ADEQUATE

## 2020-10-01 ENCOUNTER — Encounter: Payer: Medicaid Other | Admitting: Podiatry

## 2020-10-01 ENCOUNTER — Other Ambulatory Visit: Payer: Self-pay

## 2020-10-11 NOTE — Progress Notes (Signed)
This encounter was created in error - please disregard.

## 2021-02-21 ENCOUNTER — Other Ambulatory Visit: Payer: Medicaid Other

## 2022-09-04 IMAGING — DX DG FOOT 2V*R*
2 series · 2 of 2 positions shown · non-contrast
Comparison: None.

CLINICAL DATA: Infection

Right great toe is red and infected for 3 days
EXAM:
RIGHT FOOT - 2 VIEW

[foot ap]
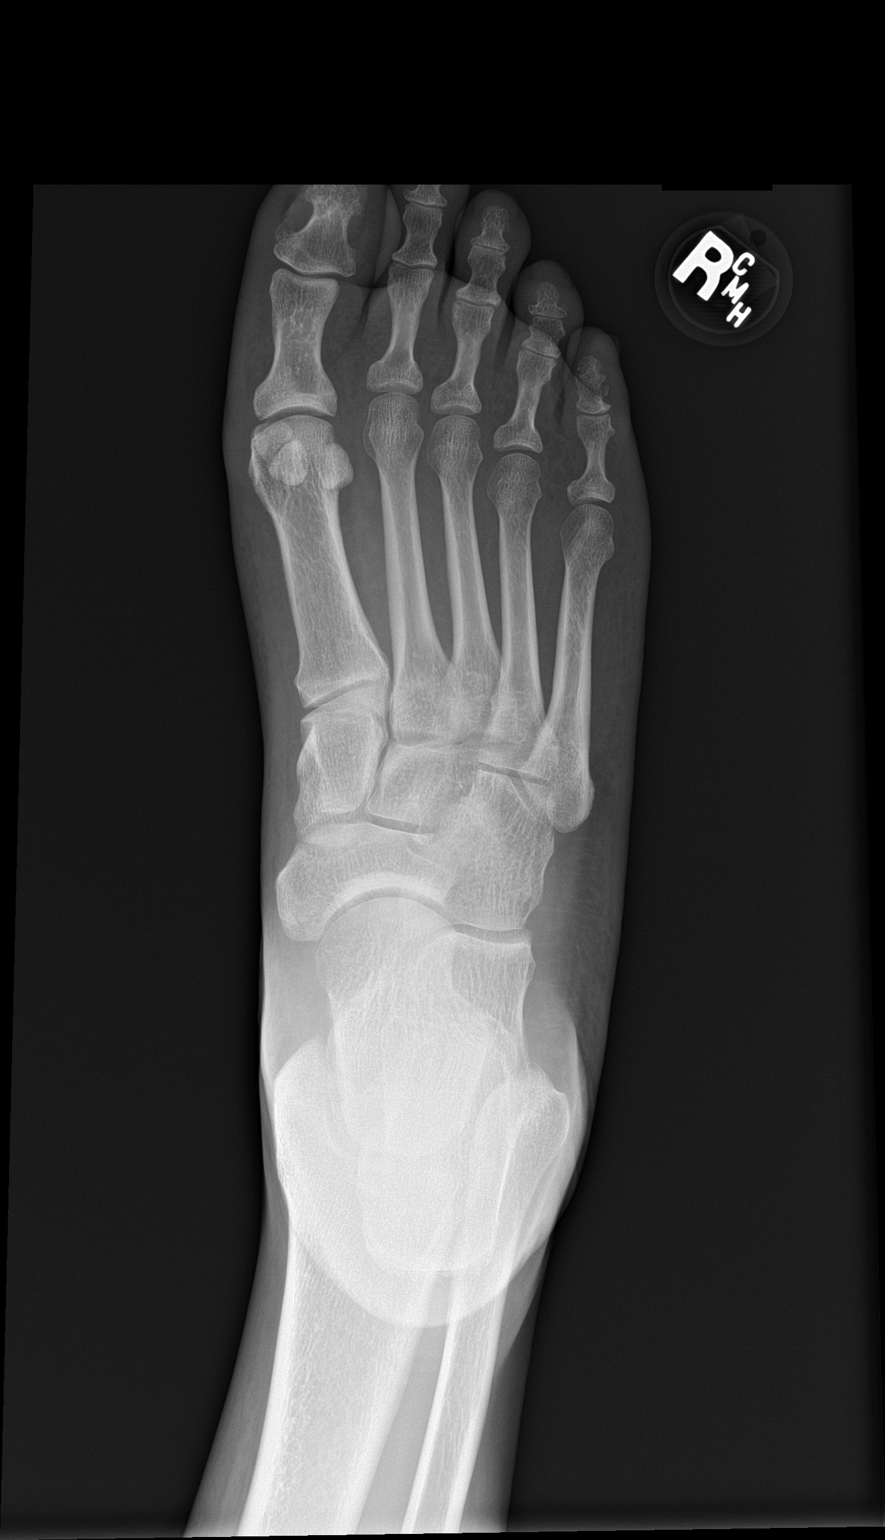

[foot lat]
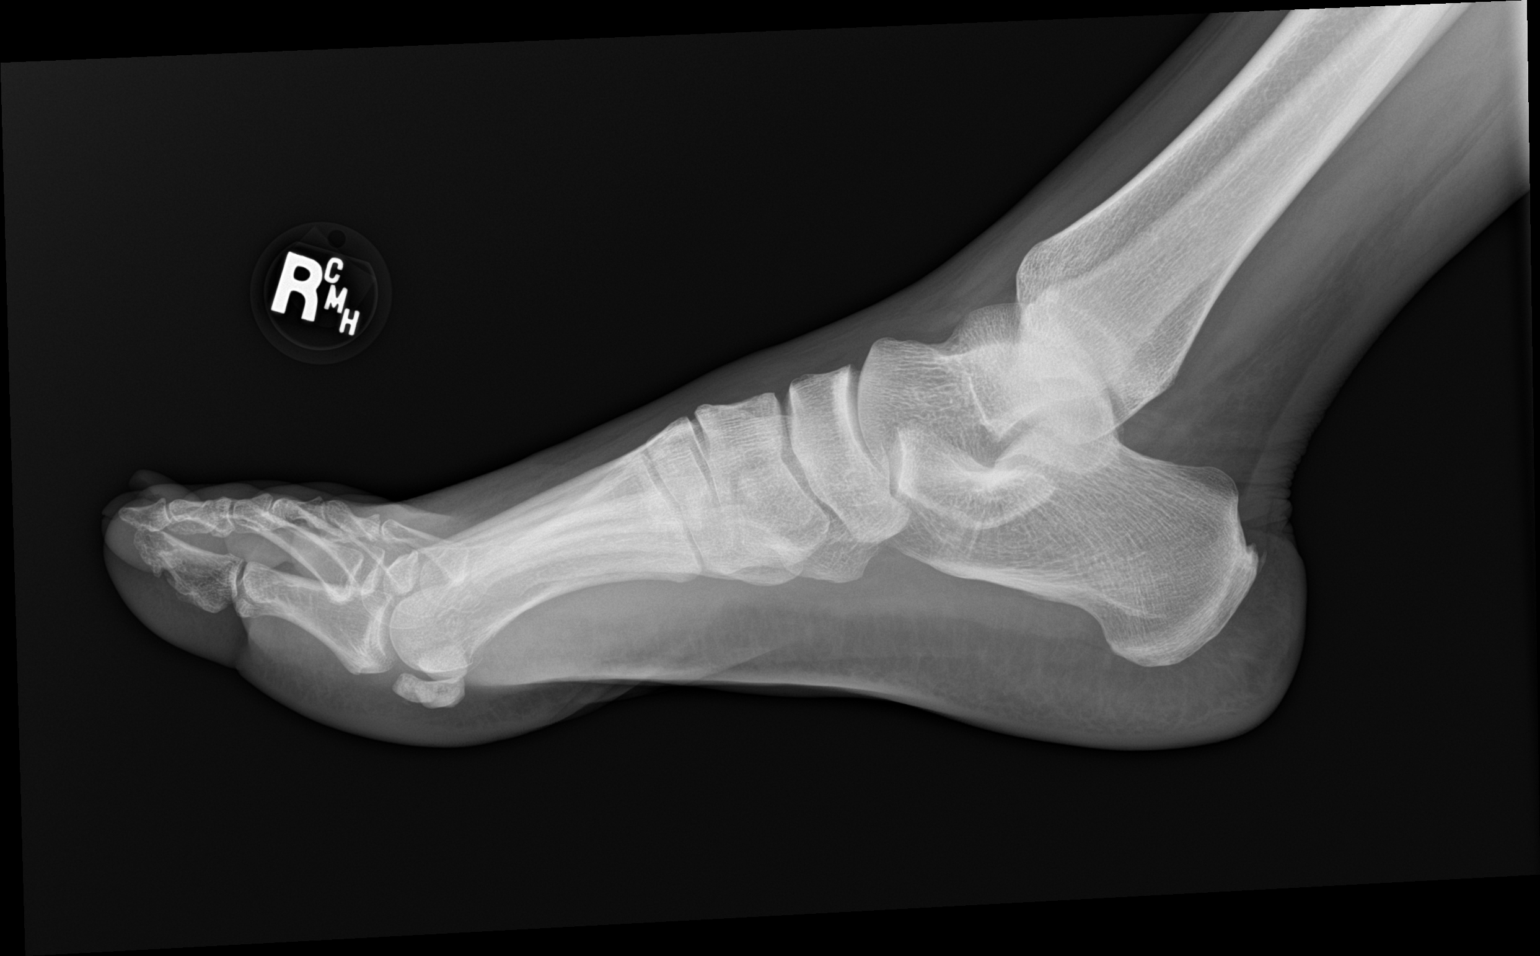

[2 of 2 positions shown; findings below may reference images not displayed]

FINDINGS: There is no evidence of fracture or dislocation. There is no
evidence of arthropathy or other focal bone abnormality. Soft
tissues are unremarkable.
IMPRESSION: No acute abnormality of the right foot. If there is continued
concern for right great toe osteomyelitis, further evaluation with
MRI should be performed.
